# Patient Record
Sex: Male | Born: 1975 | Race: White | Hispanic: No | Marital: Married | Smoking: Never smoker
Health system: Southern US, Community
[De-identification: ages and names within clinical notes are randomized; demographics above are authoritative.]

## PROBLEM LIST (undated history)

## (undated) DIAGNOSIS — I10 Essential (primary) hypertension: Secondary | ICD-10-CM

## (undated) DIAGNOSIS — E785 Hyperlipidemia, unspecified: Secondary | ICD-10-CM

## (undated) DIAGNOSIS — T7840XA Allergy, unspecified, initial encounter: Secondary | ICD-10-CM

## (undated) DIAGNOSIS — E119 Type 2 diabetes mellitus without complications: Secondary | ICD-10-CM

## (undated) DIAGNOSIS — I219 Acute myocardial infarction, unspecified: Secondary | ICD-10-CM

## (undated) HISTORY — DX: Allergy, unspecified, initial encounter: T78.40XA

## (undated) HISTORY — DX: Hyperlipidemia, unspecified: E78.5

## (undated) HISTORY — DX: Type 2 diabetes mellitus without complications: E11.9

## (undated) HISTORY — DX: Acute myocardial infarction, unspecified: I21.9

## (undated) HISTORY — PX: HERNIA REPAIR: SHX51

## (undated) HISTORY — DX: Essential (primary) hypertension: I10

---

## 2019-03-07 ENCOUNTER — Encounter: Payer: Self-pay | Admitting: Physician Assistant

## 2019-03-07 ENCOUNTER — Ambulatory Visit (INDEPENDENT_AMBULATORY_CARE_PROVIDER_SITE_OTHER): Payer: BLUE CROSS/BLUE SHIELD | Admitting: Physician Assistant

## 2019-03-07 VITALS — BP 153/106 | HR 91 | Ht 70.5 in | Wt 300.0 lb

## 2019-03-07 DIAGNOSIS — E785 Hyperlipidemia, unspecified: Secondary | ICD-10-CM

## 2019-03-07 DIAGNOSIS — I2510 Atherosclerotic heart disease of native coronary artery without angina pectoris: Secondary | ICD-10-CM | POA: Insufficient documentation

## 2019-03-07 DIAGNOSIS — E1169 Type 2 diabetes mellitus with other specified complication: Secondary | ICD-10-CM | POA: Diagnosis not present

## 2019-03-07 DIAGNOSIS — E1159 Type 2 diabetes mellitus with other circulatory complications: Secondary | ICD-10-CM

## 2019-03-07 DIAGNOSIS — I10 Essential (primary) hypertension: Secondary | ICD-10-CM

## 2019-03-07 DIAGNOSIS — K219 Gastro-esophageal reflux disease without esophagitis: Secondary | ICD-10-CM | POA: Insufficient documentation

## 2019-03-07 DIAGNOSIS — E1065 Type 1 diabetes mellitus with hyperglycemia: Secondary | ICD-10-CM

## 2019-03-07 DIAGNOSIS — I152 Hypertension secondary to endocrine disorders: Secondary | ICD-10-CM

## 2019-03-07 MED ORDER — AMLODIPINE BESYLATE 5 MG PO TABS: 5.0000 mg | ORAL_TABLET | Freq: Every day | ORAL | 0 refills | Status: DC

## 2019-03-07 MED ORDER — ATORVASTATIN CALCIUM 20 MG PO TABS: 20.0000 mg | ORAL_TABLET | Freq: Every day | ORAL | 0 refills | Status: AC

## 2019-03-07 MED ORDER — BENZONATATE 100 MG PO CAPS: 100.0000 mg | ORAL_CAPSULE | Freq: Three times a day (TID) | ORAL | 0 refills | Status: AC | PRN

## 2019-03-07 NOTE — Progress Notes (Signed)
Subjective:    Patient ID: Cameron Lloyd, male    DOB: [birth year], 43 y.o.   MRN: [id number]  Cameron Lloyd is a 43 y.o. male presenting on 03/07/2019 for New Patient (Initial Visit)  Virtual Visit via Video Note  I connected with Cameron Lloyd on 03/07/19 at  3:00 PM EDT by a video enabled telemedicine application and verified that I am speaking with the correct person using two identifiers.   I discussed the limitations of evaluation and management by telemedicine and the availability of in person appointments. The patient expressed understanding and agreed to proceed.  HPI  Recently moved to the area - went to urgent care to refill meds - lives [place]. 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 Living in [place] with wife and son aged 57.  Diabetes Mellitus Age 89: Says he was diagnosed at age 62 and he was on metformin only from age 40 to 24. Says he doesn't know whether he has Type I or Type II diabetes but reports he was told he was born diabetic. He says he was only on an oral medication until age 85 when he was hospitalized for a diabetic coma, after which he went on insulin. His diabetes was previously managed by his primary care doctor. He reports he is taking Levemir 70 units twice daily and uses up to 90 units novolog sliding scale. When asked about his sliding scale he says he uses 15-20 units prior to his three meals a day. Then he takes his sugar after his meal times and "adjusts" it but cannot specify scale he uses to do this. Reports he was previously on Lantus and Humalog but his insurance stopped paying for this and he had to switch to Levemir and Novolog. Since then, he feels his sugars have been difficult to control and he reports his last A1cs have been in the 10's.  HTN: Currently taking Lisinopril-HCTZ 20-25 mg daily. His blood pressure is elevated today and he reports that is a typical reading for him. He is wondering if he needs any changes to  his blood pressure medication.   HLD: Reports he is taking lipitor 20 mg daily and has no issues with it.   Positive for covid 19: Reports he tested positive on 03/01/2019 and he has been in self quarantine since then. He reports he feels well other than a nagging dry cough. He denies fevers, chills, nausea, vomiting, shortness of breath.   Social History   Tobacco Use  . Smoking status: Never Smoker  . Smokeless tobacco: Never Used  Substance Use Topics  . Alcohol use: Not Currently  . Drug use: Never    Review of Systems Per HPI unless specifically indicated above     Objective:    BP (!) 153/106 (BP Location: Right Arm, Patient Position: Sitting, Cuff Size: Large)   Pulse 91   Ht 5' 10.5" (1.791 m)   Wt 300 lb (136.1 kg)   BMI 42.44 kg/m   Wt Readings from Last 3 Encounters:  03/07/19 300 lb (136.1 kg)    Physical Exam Constitutional:      Appearance: Normal appearance.  Pulmonary:     Effort: Pulmonary effort is normal. No respiratory distress.  Neurological:     Mental Status: He is alert.  Psychiatric:        Mood and Affect: Mood normal.        Behavior: Behavior normal.    No results found for this  or any previous visit.    Assessment & Plan:  1. Type 1 diabetes mellitus with hyperglycemia (HCC)  A little puzzled about his history. From what it sounds like, he may have had slowly progressing or late onset Type I diabetes, though possibly could have early onset Type II diabetes. Either way, his insulin requirement is quite high and A1c is still uncontrolled. I think he would do best under the care of an endocrinologist and he is agreeable.   - Ambulatory referral to Endocrinology - atorvastatin (LIPITOR) 20 MG tablet; Take 1 tablet (20 mg total) by mouth daily.  Dispense: 90 tablet; Refill: 0  2. Hypertension associated with diabetes (HCC)  We will add amlodipine 5 mg as below. Will see him in office in two months.    - amLODipine (NORVASC) 5 MG tablet;  Take 1 tablet (5 mg total) by mouth daily.  Dispense: 90 tablet; Refill: 0  3. COVID-19 virus infection  He has been advised to remain in quarantine for 2 weeks and to have all symptoms resolved before interacting with anybody.   - benzonatate (TESSALON PERLES) 100 MG capsule; Take 1 capsule (100 mg total) by mouth 3 (three) times daily as needed for up to 7 days.  Dispense: 21 capsule; Refill: 0  4. Hyperlipidemia associated with type 2 diabetes mellitus (HCC)  Continue Lipitor 20 mg daily.    Patient location: home [facility]  Persons involved in the visit: patient, provider   Follow up plan: Return in about 2 months (around 05/07/2019) for HTN, DM, HLD.  Osvaldo AngstAdriana Pollak, PA-C [facility]  [facility] 03/15/2019, 11:58 AM

## 2019-03-07 NOTE — Patient Instructions (Signed)
Diabetes Mellitus and Exercise Exercising regularly is important for your overall health, especially when you have diabetes (diabetes mellitus). Exercising is not only about losing weight. It has many other health benefits, such as increasing muscle strength and bone density and reducing body fat and stress. This leads to improved fitness, flexibility, and endurance, all of which result in better overall health. Exercise has additional benefits for people with diabetes, including:  Reducing appetite.  Helping to lower and control blood glucose.  Lowering blood pressure.  Helping to control amounts of fatty substances (lipids) in the blood, such as cholesterol and triglycerides.  Helping the body to respond better to insulin (improving insulin sensitivity).  Reducing how much insulin the body needs.  Decreasing the risk for heart disease by: ? Lowering cholesterol and triglyceride levels. ? Increasing the levels of good cholesterol. ? Lowering blood glucose levels. What is my activity plan? Your health care provider or certified diabetes educator can help you make a plan for the type and frequency of exercise (activity plan) that works for you. Make sure that you:  Do at least 150 minutes of moderate-intensity or vigorous-intensity exercise each week. This could be brisk walking, biking, or water aerobics. ? Do stretching and strength exercises, such as yoga or weightlifting, at least 2 times a week. ? Spread out your activity over at least 3 days of the week.  Get some form of physical activity every day. ? Do not go more than 2 days in a row without some kind of physical activity. ? Avoid being inactive for more than 30 minutes at a time. Take frequent breaks to walk or stretch.  Choose a type of exercise or activity that you enjoy, and set realistic goals.  Start slowly, and gradually increase the intensity of your exercise over time. What do I need to know about managing my  diabetes?   Check your blood glucose before and after exercising. ? If your blood glucose is 240 mg/dL (13.3 mmol/L) or higher before you exercise, check your urine for ketones. If you have ketones in your urine, do not exercise until your blood glucose returns to normal. ? If your blood glucose is 100 mg/dL (5.6 mmol/L) or lower, eat a snack containing 15-20 grams of carbohydrate. Check your blood glucose 15 minutes after the snack to make sure that your level is above 100 mg/dL (5.6 mmol/L) before you start your exercise.  Know the symptoms of low blood glucose (hypoglycemia) and how to treat it. Your risk for hypoglycemia increases during and after exercise. Common symptoms of hypoglycemia can include: ? Hunger. ? Anxiety. ? Sweating and feeling clammy. ? Confusion. ? Dizziness or feeling light-headed. ? Increased heart rate or palpitations. ? Blurry vision. ? Tingling or numbness around the mouth, lips, or tongue. ? Tremors or shakes. ? Irritability.  Keep a rapid-acting carbohydrate snack available before, during, and after exercise to help prevent or treat hypoglycemia.  Avoid injecting insulin into areas of the body that are going to be exercised. For example, avoid injecting insulin into: ? The arms, when playing tennis. ? The legs, when jogging.  Keep records of your exercise habits. Doing this can help you and your health care provider adjust your diabetes management plan as needed. Write down: ? Food that you eat before and after you exercise. ? Blood glucose levels before and after you exercise. ? The type and amount of exercise you have done. ? When your insulin is expected to peak, if you use   insulin. Avoid exercising at times when your insulin is peaking.  When you start a new exercise or activity, work with your health care provider to make sure the activity is safe for you, and to adjust your insulin, medicines, or food intake as needed.  Drink plenty of water while  you exercise to prevent dehydration or heat stroke. Drink enough fluid to keep your urine clear or pale yellow. Summary  Exercising regularly is important for your overall health, especially when you have diabetes (diabetes mellitus).  Exercising has many health benefits, such as increasing muscle strength and bone density and reducing body fat and stress.  Your health care provider or certified diabetes educator can help you make a plan for the type and frequency of exercise (activity plan) that works for you.  When you start a new exercise or activity, work with your health care provider to make sure the activity is safe for you, and to adjust your insulin, medicines, or food intake as needed. This information is not intended to replace advice given to you by your health care provider. Make sure you discuss any questions you have with your health care provider. Document Released: 02/04/2004 Document Revised: 05/25/2017 Document Reviewed: 04/25/2016 Elsevier Interactive Patient Education  2019 [employer]  

## 2019-04-08 ENCOUNTER — Ambulatory Visit
Admission: RE | Admit: 2019-04-08 | Discharge: 2019-04-08 | Disposition: A | Discharge status: Home / Self Care | Payer: Worker's Compensation | Source: Ambulatory Visit | Attending: Family Medicine | Admitting: Family Medicine

## 2019-04-08 ENCOUNTER — Other Ambulatory Visit: Payer: Self-pay | Admitting: Family Medicine

## 2019-04-08 ENCOUNTER — Other Ambulatory Visit: Payer: Self-pay

## 2019-04-08 DIAGNOSIS — R52 Pain, unspecified: Secondary | ICD-10-CM

## 2019-04-23 ENCOUNTER — Other Ambulatory Visit: Payer: Self-pay | Admitting: *Deleted

## 2019-04-23 DIAGNOSIS — E1159 Type 2 diabetes mellitus with other circulatory complications: Secondary | ICD-10-CM

## 2019-04-23 DIAGNOSIS — I152 Hypertension secondary to endocrine disorders: Secondary | ICD-10-CM

## 2019-04-23 DIAGNOSIS — E1065 Type 1 diabetes mellitus with hyperglycemia: Secondary | ICD-10-CM

## 2019-04-23 NOTE — Telephone Encounter (Signed)
Patient is also requesting Zanaflex, however this is not on his med list. Please advise?

## 2019-04-24 MED ORDER — AMLODIPINE BESYLATE 5 MG PO TABS: 5.0000 mg | ORAL_TABLET | Freq: Every day | ORAL | 0 refills | Status: DC

## 2019-04-24 MED ORDER — LISINOPRIL-HYDROCHLOROTHIAZIDE 20-25 MG PO TABS: 1.0000 | ORAL_TABLET | Freq: Every day | ORAL | 0 refills | Status: DC

## 2019-04-24 MED ORDER — NOVOLOG FLEXPEN 100 UNIT/ML ~~LOC~~ SOPN: 20.0000 [IU] | PEN_INJECTOR | Freq: Every day | SUBCUTANEOUS | 1 refills | Status: AC

## 2019-04-24 NOTE — Telephone Encounter (Signed)
What's the zanaflex for, we didn't discuss it. Also, he needs to schedule with endocrinology they have attempted to contact him but could not reach him.

## 2019-04-24 NOTE — Telephone Encounter (Signed)
Please review

## 2019-05-01 NOTE — Telephone Encounter (Signed)
Patient reports that he did receive a call from endo, but missed it. Patient reports he did call back and they didn't know who or what they called for.  [name] will please help with this referral.

## 2019-05-07 ENCOUNTER — Ambulatory Visit: Payer: Self-pay | Admitting: Physician Assistant

## 2019-06-22 IMAGING — CR LUMBAR SPINE - COMPLETE 4+ VIEW
1 series · 6 of 6 positions shown · non-contrast
Comparison: None.

CLINICAL DATA: Low back pain.

EXAM:
LUMBAR SPINE - COMPLETE 4+ VIEW

[Series 1: dg lumbar spine complete 4 +v · 0.14mm/px · 6 of 6 slices shown]
[im 1/6]
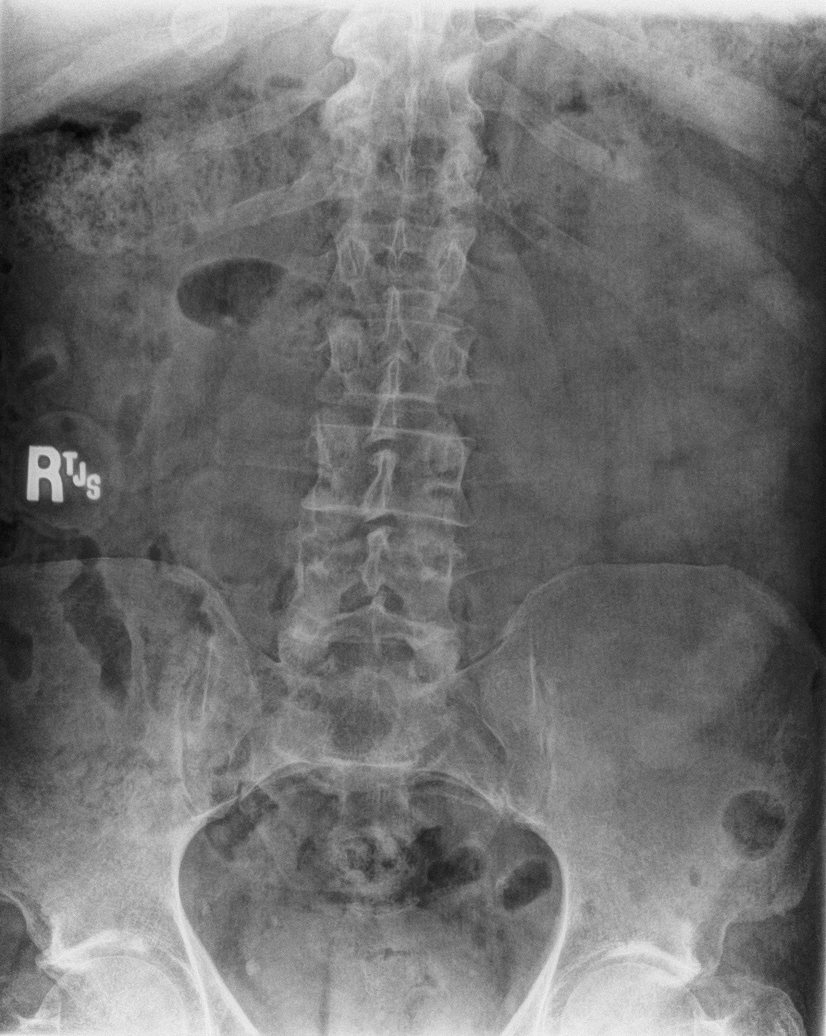
[im 2/6]
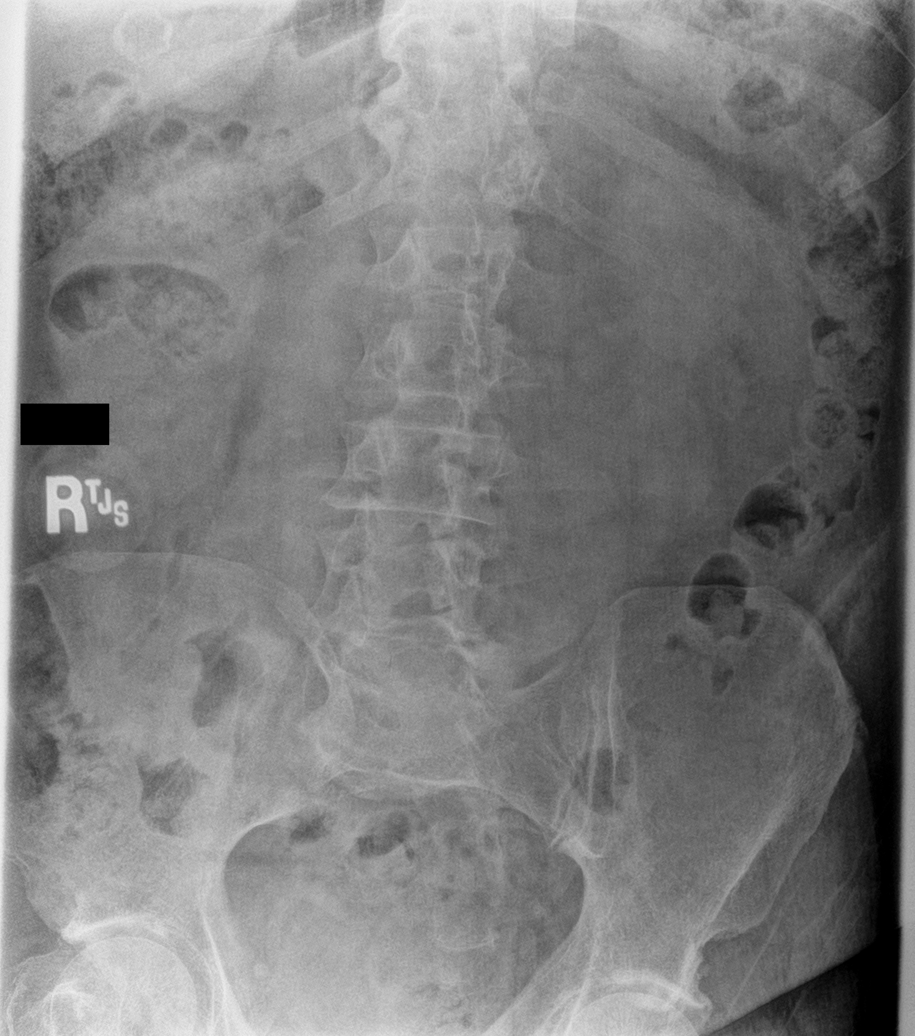
[im 3/6]
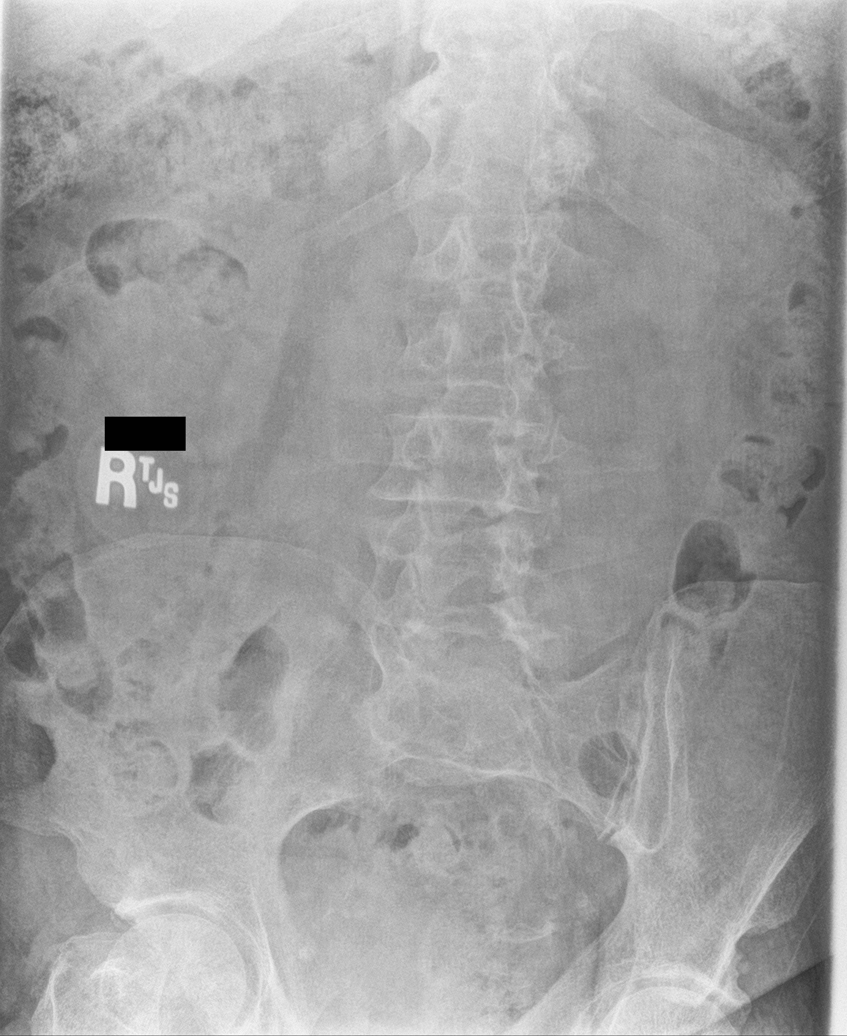
[im 4/6]
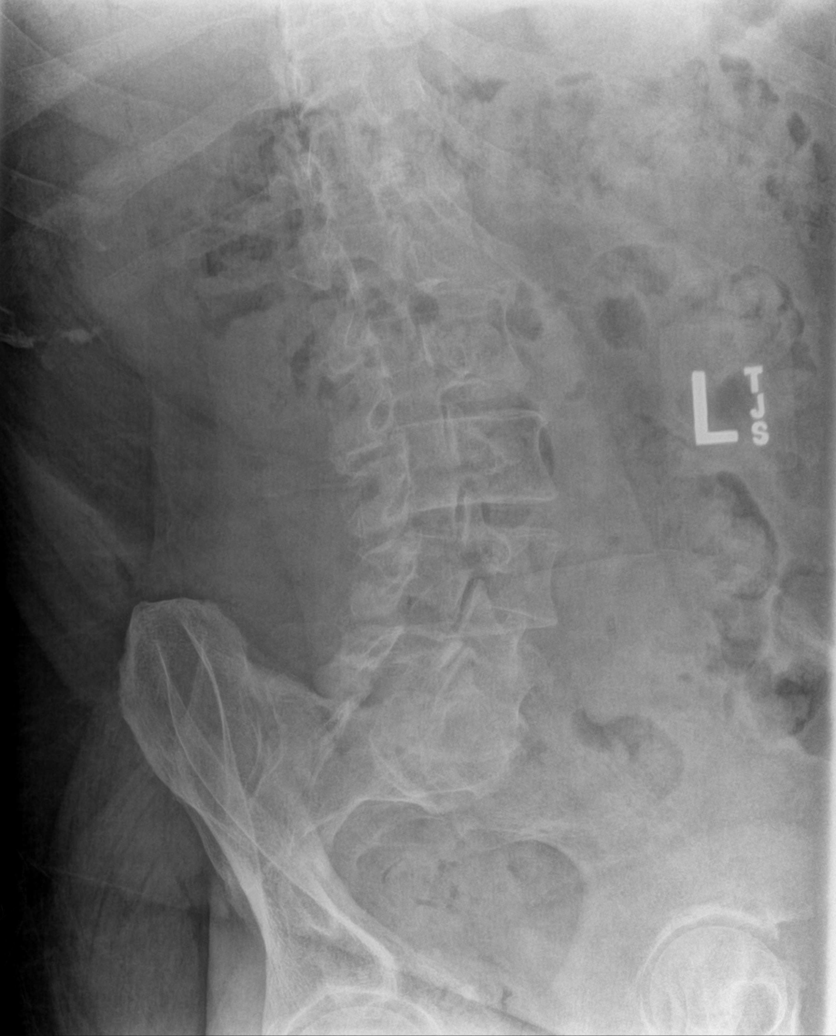
[im 5/6]
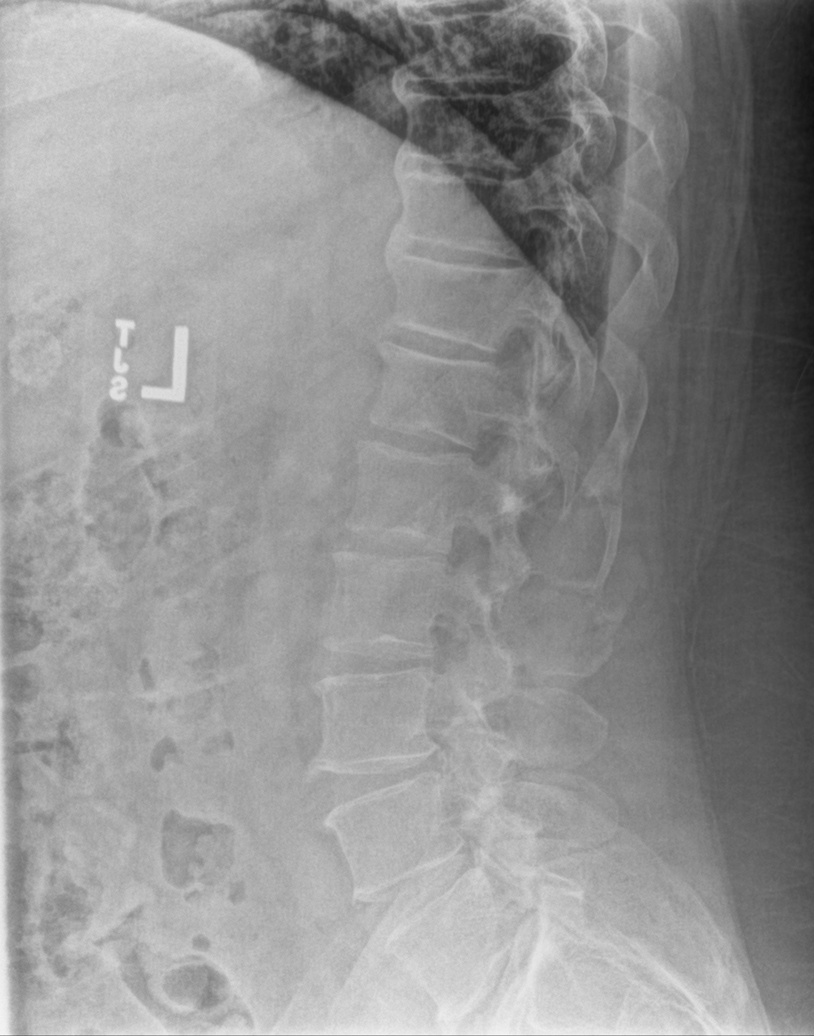
[im 6/6]
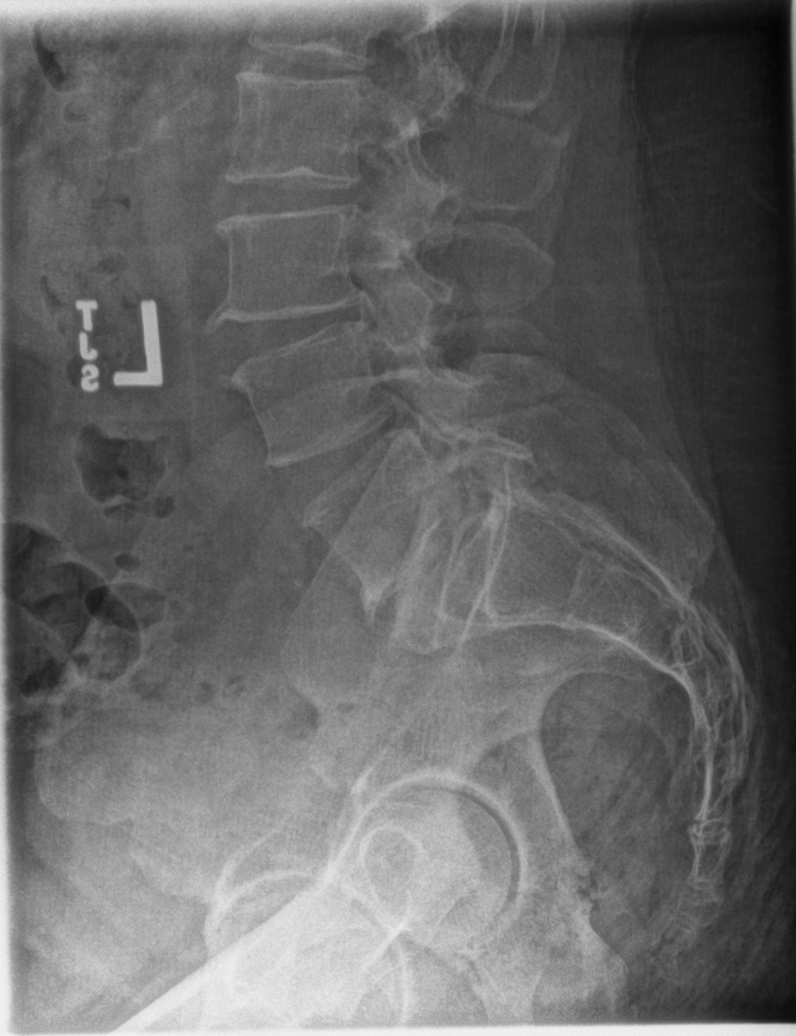

[6 of 6 positions shown; findings below may reference images not displayed]

FINDINGS: There is no evidence of lumbar spine fracture. Alignment is normal
except for 3 mm of anterolisthesis L5-S1. Intervertebral disc spaces
are narrowed particularly at L5-S1. Anterior osseous spurring. Lower
lumbar facet arthropathy.
IMPRESSION: No acute findings.

## 2019-07-26 ENCOUNTER — Other Ambulatory Visit: Payer: Self-pay | Admitting: Physician Assistant

## 2019-07-26 DIAGNOSIS — E1159 Type 2 diabetes mellitus with other circulatory complications: Secondary | ICD-10-CM

## 2019-07-26 DIAGNOSIS — I152 Hypertension secondary to endocrine disorders: Secondary | ICD-10-CM

## 2019-07-26 NOTE — Telephone Encounter (Signed)
L.O.V. was 03/07/2019 and no upcoming appointment.

## 2019-08-09 ENCOUNTER — Other Ambulatory Visit: Payer: Self-pay | Admitting: Physician Assistant

## 2019-08-09 DIAGNOSIS — E1159 Type 2 diabetes mellitus with other circulatory complications: Secondary | ICD-10-CM

## 2024-01-22 ENCOUNTER — Ambulatory Visit
Admission: RE | Admit: 2024-01-22 | Discharge: 2024-01-22 | Disposition: A | Discharge status: Home / Self Care | Payer: Worker's Compensation | Source: Ambulatory Visit | Attending: Physician Assistant | Admitting: Physician Assistant

## 2024-01-22 ENCOUNTER — Other Ambulatory Visit: Payer: Self-pay | Admitting: Physician Assistant

## 2024-01-22 DIAGNOSIS — M7989 Other specified soft tissue disorders: Secondary | ICD-10-CM | POA: Diagnosis present

## 2024-01-22 DIAGNOSIS — S6992XA Unspecified injury of left wrist, hand and finger(s), initial encounter: Secondary | ICD-10-CM | POA: Insufficient documentation
